# Patient Record
Sex: Male | Born: 1990 | Race: White | Hispanic: No | Marital: Single | Smoking: Current every day smoker
Health system: Southern US, Community
[De-identification: ages and names within clinical notes are randomized; demographics above are authoritative.]

---
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
## 2012-01-02 ENCOUNTER — Emergency Department: Payer: Self-pay | Admitting: Emergency Medicine

## 2012-01-02 ENCOUNTER — Emergency Department: Payer: Self-pay | Admitting: *Deleted

## 2012-03-14 ENCOUNTER — Emergency Department: Payer: Self-pay | Admitting: *Deleted

## 2012-03-14 LAB — CBC
HCT: 42.1 % (ref 40.0–52.0)
MCHC: 35 g/dL (ref 32.0–36.0)
MCV: 88 fL (ref 80–100)
RDW: 13 % (ref 11.5–14.5)
WBC: 6.4 10*3/uL (ref 3.8–10.6)

## 2012-03-14 LAB — COMPREHENSIVE METABOLIC PANEL
Albumin: 4 g/dL (ref 3.4–5.0)
Alkaline Phosphatase: 68 U/L (ref 50–136)
Anion Gap: 8 (ref 7–16)
BUN: 22 mg/dL — ABNORMAL HIGH (ref 7–18)
Bilirubin,Total: 0.4 mg/dL (ref 0.2–1.0)
Chloride: 108 mmol/L — ABNORMAL HIGH (ref 98–107)
Creatinine: 1.02 mg/dL (ref 0.60–1.30)
Glucose: 121 mg/dL — ABNORMAL HIGH (ref 65–99)
Potassium: 3.6 mmol/L (ref 3.5–5.1)
SGOT(AST): 20 U/L (ref 15–37)
Sodium: 141 mmol/L (ref 136–145)
Total Protein: 6.8 g/dL (ref 6.4–8.2)

## 2012-03-14 LAB — URINALYSIS, COMPLETE
Glucose,UR: NEGATIVE mg/dL (ref 0–75)
Ketone: NEGATIVE
Nitrite: NEGATIVE
Specific Gravity: 1.025 (ref 1.003–1.030)
WBC UR: 1 /HPF (ref 0–5)

## 2012-03-14 LAB — LIPASE, BLOOD: Lipase: 115 U/L (ref 73–393)

## 2014-08-13 ENCOUNTER — Emergency Department: Payer: Self-pay | Admitting: Emergency Medicine

## 2016-07-25 IMAGING — CR DG LUMBAR SPINE 2-3V
1 series · 3 of 3 positions shown · non-contrast
Comparison: None.

CLINICAL DATA: Forklift driver backed into metal shelf, with acute
onset of lower back pain. Initial encounter.

EXAM:
LUMBAR SPINE - 2-3 VIEW

[Series 1: dxr lumbar spine ap and lateral · 0.14mm/px · 3 of 3 slices shown]
[im 1/3]
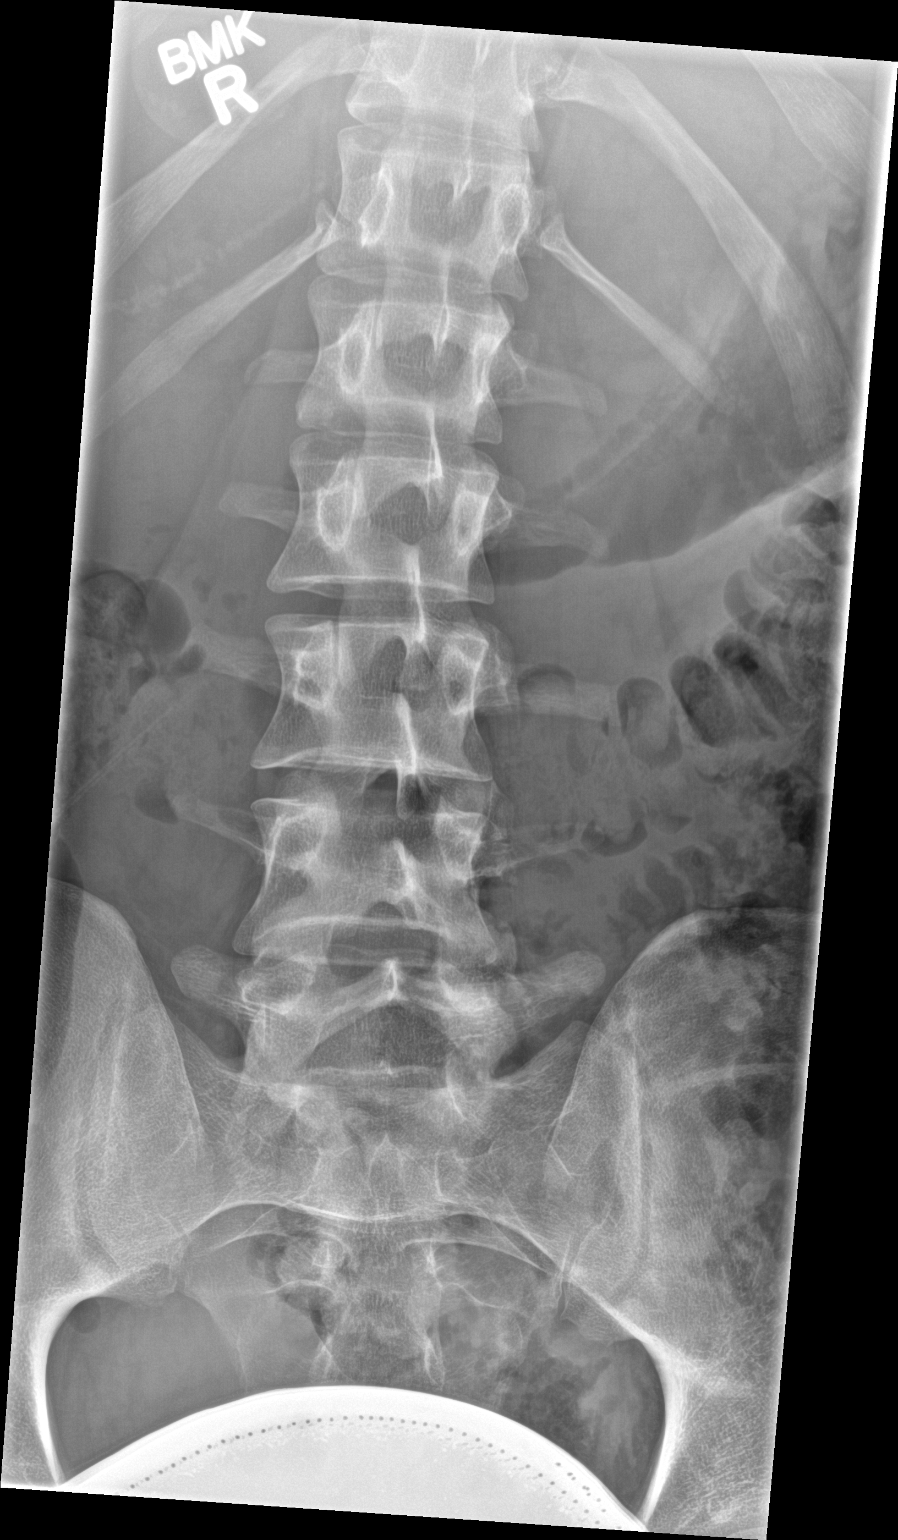
[im 2/3]
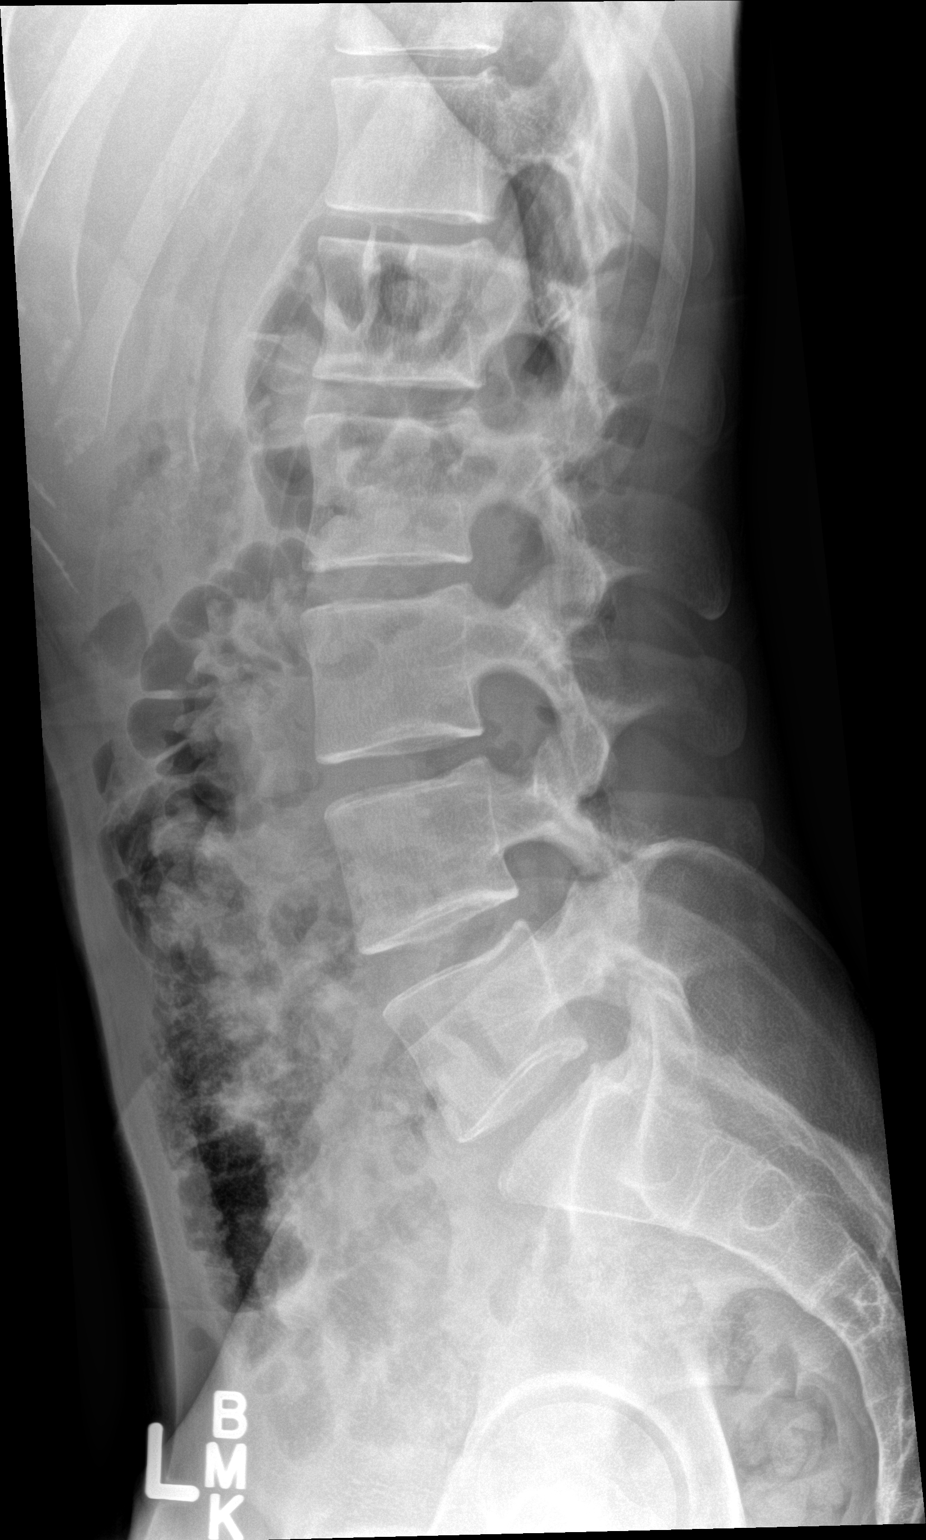
[im 3/3]
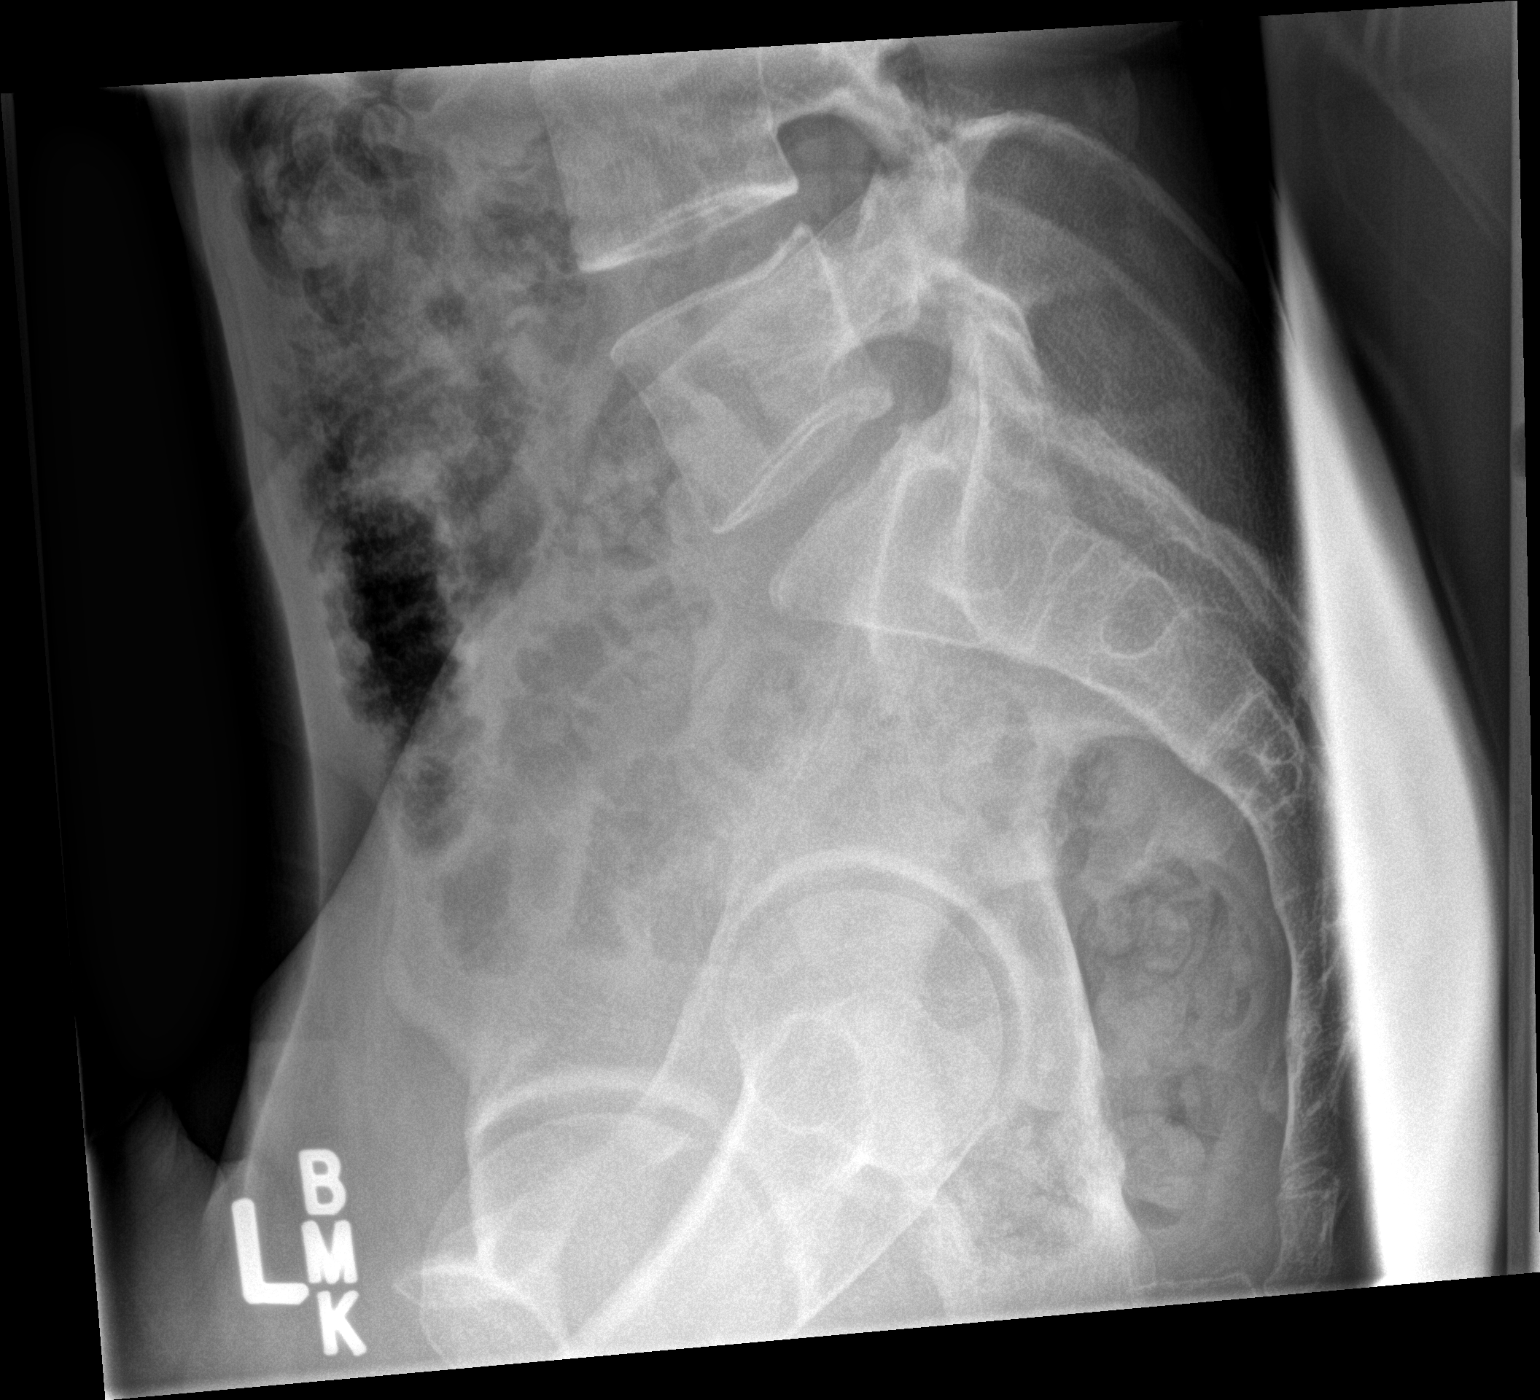

[3 of 3 positions shown; findings below may reference images not displayed]

FINDINGS: There is no evidence of fracture or subluxation. Vertebral bodies
demonstrate normal height and alignment. Intervertebral disc spaces
are preserved. The visualized neural foramina are grossly
unremarkable in appearance.

The visualized bowel gas pattern is unremarkable in appearance; air
and stool are noted within the colon. The sacroiliac joints are
within normal limits.
IMPRESSION: No evidence of fracture or subluxation along the lumbar spine.

## 2017-01-15 ENCOUNTER — Emergency Department
Admission: EM | Admit: 2017-01-15 | Discharge: 2017-01-15 | Disposition: A | Discharge status: Home / Self Care | Payer: Self-pay | Attending: Emergency Medicine | Admitting: Emergency Medicine

## 2017-01-15 ENCOUNTER — Encounter: Payer: Self-pay | Admitting: Emergency Medicine

## 2017-01-15 DIAGNOSIS — F17200 Nicotine dependence, unspecified, uncomplicated: Secondary | ICD-10-CM | POA: Insufficient documentation

## 2017-01-15 DIAGNOSIS — Y990 Civilian activity done for income or pay: Secondary | ICD-10-CM | POA: Insufficient documentation

## 2017-01-15 DIAGNOSIS — X509XXA Other and unspecified overexertion or strenuous movements or postures, initial encounter: Secondary | ICD-10-CM | POA: Insufficient documentation

## 2017-01-15 DIAGNOSIS — S39012A Strain of muscle, fascia and tendon of lower back, initial encounter: Secondary | ICD-10-CM | POA: Insufficient documentation

## 2017-01-15 DIAGNOSIS — Y939 Activity, unspecified: Secondary | ICD-10-CM | POA: Insufficient documentation

## 2017-01-15 DIAGNOSIS — Y929 Unspecified place or not applicable: Secondary | ICD-10-CM | POA: Insufficient documentation

## 2017-01-15 LAB — URINALYSIS, COMPLETE (UACMP) WITH MICROSCOPIC
BILIRUBIN URINE: NEGATIVE
Bacteria, UA: NONE SEEN
Glucose, UA: NEGATIVE mg/dL
Hgb urine dipstick: NEGATIVE
Ketones, ur: NEGATIVE mg/dL
Leukocytes, UA: NEGATIVE
Nitrite: NEGATIVE
Protein, ur: NEGATIVE mg/dL
SPECIFIC GRAVITY, URINE: 1.017 (ref 1.005–1.030)
pH: 6 (ref 5.0–8.0)

## 2017-01-15 MED ORDER — NAPROXEN 500 MG PO TBEC: 500.0000 mg | DELAYED_RELEASE_TABLET | Freq: Two times a day (BID) | ORAL | 0 refills | Status: AC

## 2017-01-15 MED ORDER — KETOROLAC TROMETHAMINE 60 MG/2ML IM SOLN
30.0000 mg | Freq: Once | INTRAMUSCULAR | Status: AC
  Administered 2017-01-15: 30 mg via INTRAMUSCULAR
  Filled 2017-01-15: qty 2

## 2017-01-15 MED ORDER — ORPHENADRINE CITRATE 30 MG/ML IJ SOLN
60.0000 mg | INTRAMUSCULAR | Status: AC
  Administered 2017-01-15: 60 mg via INTRAMUSCULAR
  Filled 2017-01-15 (×2): qty 2

## 2017-01-15 MED ORDER — CYCLOBENZAPRINE HCL 5 MG PO TABS: 5.0000 mg | ORAL_TABLET | Freq: Three times a day (TID) | ORAL | 0 refills | Status: AC | PRN

## 2017-01-15 NOTE — ED Notes (Addendum)
Pt states that on Friday his lower back started hurting, pt denies any injury, states pain increases to bend, no pain with walking, pt also concerned about a poss kidney infection due to the location of the pain, pt denies diff urinating. Pt states that he took tylenol at 1500, reports some relief at that time

## 2017-01-15 NOTE — ED Triage Notes (Signed)
Pt ambulatory to triage with steady gait, no distress noted. Pt c/o lower back pain x3 days. Pt expresses's pain started after lifting heavy object on Friday. Pt denies urinary symptoms.

## 2017-01-15 NOTE — Discharge Instructions (Signed)
Your exam is consistent with a lumbar strain. Take the prescription meds as directed. Apply ice to reduce symptoms. [facility] or return to the ED as needed.

## 2017-01-15 NOTE — ED Provider Notes (Signed)
[facility] Emergency Department Provider Note ____________________________________________  Time seen: 2155  I have reviewed the triage vital signs and the nursing notes.  HISTORY  Chief Complaint  Back Pain  HPI Richard Murphy is a 26 y.o. male visit to the ED for evaluation ofbilateral low back pain for the last 3 days. Patient describes onset occurred after lifting heavy object at work on Friday. He denies any distal paresthesias, foot drop, leg weakness. He also denies any urinary incontinence, hematuria, or retention. He has been dosing over-the-counter Tylenol and aspirin with limited benefit.  History reviewed. No pertinent past medical history.  There are no active problems to display for this patient.  History reviewed. No pertinent surgical history.  Prior to Admission medications   Medication Sig Start Date End Date Taking? Authorizing Provider  cyclobenzaprine (FLEXERIL) 5 MG tablet Take 1 tablet (5 mg total) by mouth 3 (three) times daily as needed for muscle spasms. 01/15/17   Shastina Rua, Charlesetta Ivory, PA-C  naproxen (EC NAPROSYN) 500 MG EC tablet Take 1 tablet (500 mg total) by mouth 2 (two) times daily with a meal. 01/15/17   Nilaya Bouie, Charlesetta Ivory, PA-C    Allergies Patient has no known allergies.  History reviewed. No pertinent family history.  Social History Social History  Substance Use Topics  . Smoking status: Current Every Day Smoker  . Smokeless tobacco: Never Used  . Alcohol use No    Review of Systems  Constitutional: Negative for fever. Cardiovascular: Negative for chest pain. Respiratory: Negative for shortness of breath. Genitourinary: Negative for dysuria. Musculoskeletal: Positive for back pain. Skin: Negative for rash. Neurological: Negative for headaches, focal weakness or numbness. ____________________________________________  PHYSICAL EXAM:  VITAL SIGNS: ED Triage Vitals  Enc Vitals Group     BP  01/15/17 1948 121/64     Pulse Rate 01/15/17 1948 96     Resp 01/15/17 1948 16     Temp 01/15/17 1948 97.7 F (36.5 C)     Temp Source 01/15/17 1948 Oral     SpO2 01/15/17 1948 98 %     Weight 01/15/17 1948 140 lb (63.5 kg)     Height 01/15/17 1948 5\' 10"  (1.778 m)     Head Circumference --      Peak Flow --      Pain Score 01/15/17 2056 6     Pain Loc --      Pain Edu? --      Excl. in GC? --     Constitutional: Alert and oriented. Well appearing and in no distress. Head: Normocephalic and atraumatic. Cardiovascular: Normal rate, regular rhythm. Normal distal pulses. Respiratory: Normal respiratory effort. No wheezes/rales/rhonchi. Gastrointestinal: Soft and nontender. No distention. Musculoskeletal: Normal spinal alignment without midline tenderness, spasm, deformity, or step-off. Minimal lumbar flexion and extension range of motion. Negative seated straight leg raise. Nontender with normal range of motion in all extremities.  Neurologic: Cranial nerves II through XII grossly intact. Normal LE DTRs bilaterally. Normal gait without ataxia. Normal speech and language. No gross focal neurologic deficits are appreciated. Skin:  Skin is warm, dry and intact. No rash noted. Psychiatric: Mood and affect are normal. Patient exhibits appropriate insight and judgment. ____________________________________________   LABS (pertinent positives/negatives)  Labs Reviewed  URINALYSIS, COMPLETE (UACMP) WITH MICROSCOPIC - Abnormal; Notable for the following:       Result Value   Color, Urine YELLOW (*)    APPearance CLEAR (*)    Squamous Epithelial / LPF  0-5 (*)    All other components within normal limits  ____________________________________________  PROCEDURES  Toradol 30 mg IM Norflex 60 mg IM ____________________________________________  INITIAL IMPRESSION / ASSESSMENT AND PLAN / ED COURSE  Patient with what appears to be a mechanical lumbar sacral strain and anterior to injury. No  mechanism of injury to warrant radiologic imaging. Patient will be discharged with prescriptions for Toradol and Flexeril to dose as directed. He will follow with his primary care provider or local urgent care for ongoing symptom management. Return precautions were reviewed. Work note is provided for 1 day. ____________________________________________  FINAL CLINICAL IMPRESSION(S) / ED DIAGNOSES  Final diagnoses:  Strain of lumbar region, initial encounter      Shylyn Younce, JenLissa Hoardise V Bacon, PA-C 01/15/17 2231    Myrna BlazerSchaevitz, David Matthew, MD 01/15/17 2351
